# Patient Record
Sex: Female | Born: 1995 | Race: White | Hispanic: No | Marital: Single
Health system: Southern US, Community
[De-identification: ages and names within clinical notes are randomized; demographics above are authoritative.]

## PROBLEM LIST (undated history)

## (undated) DIAGNOSIS — R197 Diarrhea, unspecified: Secondary | ICD-10-CM

## (undated) DIAGNOSIS — R109 Unspecified abdominal pain: Secondary | ICD-10-CM

## (undated) DIAGNOSIS — T7840XA Allergy, unspecified, initial encounter: Secondary | ICD-10-CM

## (undated) HISTORY — DX: Unspecified abdominal pain: R10.9

## (undated) HISTORY — PX: REPAIR ANKLE LIGAMENT: SUR1187

## (undated) HISTORY — DX: Allergy, unspecified, initial encounter: T78.40XA

## (undated) HISTORY — DX: Diarrhea, unspecified: R19.7

## (undated) HISTORY — PX: WISDOM TOOTH EXTRACTION: SHX21

---
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
## 2019-10-28 DIAGNOSIS — L509 Urticaria, unspecified: Secondary | ICD-10-CM

## 2019-10-28 DIAGNOSIS — T782XXA Anaphylactic shock, unspecified, initial encounter: Secondary | ICD-10-CM

## 2019-10-29 DIAGNOSIS — T782XXA Anaphylactic shock, unspecified, initial encounter: Secondary | ICD-10-CM | POA: Diagnosis not present

## 2019-10-29 DIAGNOSIS — L509 Urticaria, unspecified: Secondary | ICD-10-CM | POA: Diagnosis not present

## 2019-10-30 DIAGNOSIS — T782XXA Anaphylactic shock, unspecified, initial encounter: Secondary | ICD-10-CM | POA: Diagnosis not present

## 2019-10-30 DIAGNOSIS — L509 Urticaria, unspecified: Secondary | ICD-10-CM | POA: Diagnosis not present

## 2019-10-31 ENCOUNTER — Other Ambulatory Visit: Payer: Self-pay

## 2019-10-31 ENCOUNTER — Encounter: Payer: Self-pay | Admitting: Allergy

## 2019-10-31 ENCOUNTER — Ambulatory Visit (INDEPENDENT_AMBULATORY_CARE_PROVIDER_SITE_OTHER): Payer: Self-pay | Admitting: Allergy

## 2019-10-31 VITALS — BP 122/54 | HR 84 | Temp 98.0°F | Resp 22 | Ht 62.3 in | Wt 135.0 lb

## 2019-10-31 DIAGNOSIS — T783XXD Angioneurotic edema, subsequent encounter: Secondary | ICD-10-CM

## 2019-10-31 DIAGNOSIS — L509 Urticaria, unspecified: Secondary | ICD-10-CM

## 2019-10-31 MED ORDER — MONTELUKAST SODIUM 10 MG PO TABS: 10.0000 mg | ORAL_TABLET | Freq: Every day | ORAL | 5 refills | Status: AC

## 2019-10-31 NOTE — Patient Instructions (Signed)
Hives and swelling  - at this time etiology of hives and swelling is unknown.  Hives/swelling can be caused by a variety of different triggers including illness/infection, foods, medications, stings, exercise, pressure, vibrations, extremes of temperature to name a few however majority of the time there is no identifiable trigger.    - your CBC and CMP (blood cell counts and liver/kidney function) were largely unremarkable.  Inflammatory markers were elevated.   - will obtain further work-up to see if we can identify any particular triggers  - for management of symptoms recommend following high-dose antihistamine regimen: Zyrtec 10mg  twice a day, Pepcid 20mg  twice a day and Singulair 10mg  daily at bedtime.  If you notice any change in mood/behavior/sleep after starting Singulair then stop this medication and let know.  Symptoms resolve after stopping the medication.    - complete prednisone course  - we discussed briefly Xolair monthly injections if high-dose antihistamine regimen above is not effective enough  - provided with AuviQ sample today and follow emergency action plan in case of allergic reaction that warrants epinephrine use  Follow-up 2-3 months or sooner if needed

## 2019-10-31 NOTE — Progress Notes (Signed)
New Patient Note  RE: Tonya Fitzpatrick MRN: [id number] DOB: 09-06-1995 Date of Office Visit: 10/31/2019  Referring provider: No ref. provider found Primary care provider: Patient, No Pcp Per  Chief Complaint: hives, swelling, reaction  History of present illness: Tonya Fitzpatrick is a 24 y.o. female presenting today for evaluation of hives and swelling.   She states about a week ago, Tuesday morning she developed red dots on her skin on her arms and in the groin area.  She states the rash later progressed all over her body.  Went to UC and reports receiving a steroid injection.  She states that didn't help and went back to UC where she got prednisone pack and pepcid to take.    The next day, Wednesday morning she states she woke up with facial sweling.   She took benadryl which did help some.  On Saturday, but following the weekend, she woke up and her lips was swollen.  Took benadryl and went back to sleep.  She woke up again and states 'everything' was swollen on her face and reports it hurt to swallow and felt like she was having difficulty breathing.   She took benadryl and went to ED.  She denies any new foods.  No new medications.  No stings.  No change in body products/lotions or detergents.  She states some of the areas where she has had hives are leaving a dark mark behind.  She also notes some aches in her neck and ankles.  Denies any true fevers but states she had low-grade temperature up to 99.3. The first week of April she thought she had strep throat due to seeing white patches at back of her throat and she went to [facility] and was told it was too early to test.  She was covid tested which was negative.  She was advised to take allergy medication.  Around April 8 she reports having stomach bug (24 hour virus she states her daughter gave to her).    She eats red meats in her diet almost everyday.   She feels she has low iron which is why she eats a lot of red meats.  She is still taking  predisone (x 6 days) and pepcid twice a day following her hospital admission.   She currently does not have an epinephrine device.  In the ED on 10/28/2019 she was noted to have stable vitals.  Her exam was notable for diffuse urticarial appearing and blanching red rash.  She was given a dose of IM epi x3.  The ED note also states that she received FFP by IV. She did have labs done in the hospital and through her admission.  Her C-reactive protein was notable to be 40.9 which is elevated.  She was also tested for mono with a negative Monospot as well as negative ASO titers.  She also had negative Lyme and Rickettsia titers. Her CBC initially had a slightly elevated white count but did improve for the course. She did have a negative Covid test. She was started on Solu-Medrol as well as Benadryl, Pepcid and hydroxyzine.  Her rash and swelling did improve and she was able to be discharged on 10/30/2019 to continue a prednisone first taper for 6 days which she is still taking.  She was also discharged to continue on Pepcid twice a day and Benadryl as needed.   She reports having seasonal allergies mostly in spring with symptoms including nasal congestion, sneezing.   She states she "  got use to it" [the symptoms] and does not take any medications for.   No history eczema, asthma or food allergy.   Review of systems: Review of Systems  Constitutional: Negative.   HENT:       See HPI   Eyes: Negative.   Respiratory:       See HPI  Cardiovascular: Negative.   Gastrointestinal: Negative.   Musculoskeletal: Negative.   Skin:       See HPI  Neurological: Negative.     All other systems negative unless noted above in HPI  Past medical history: Past Medical History:  Diagnosis Date  . Abdominal pain   . Allergic reaction   . Diarrhea     Past surgical history: Past Surgical History:  Procedure Laterality Date  . CESAREAN SECTION    . REPAIR ANKLE LIGAMENT Left   . WISDOM TOOTH EXTRACTION       Family history:  Family History  Problem Relation Age of Onset  . Asthma Sister   . Food Allergy Sister   . Depression Sister   . Asthma Brother   . Depression Brother   . Irritable bowel syndrome Mother   . Nephrolithiasis Father   . Cholelithiasis Father   . Ulcers Neg Hx   . Celiac disease Neg Hx     Social history: She lives in a home with electric heating and central cooling.  Dogs in the home.  No carpeting.  No concern for water damage, mildew or roaches in the home.  She works as a Location manager.  Denies a smoking history.  Medication List: Current Outpatient Medications  Medication Sig Dispense Refill  . diphenhydrAMINE HCl (BENADRYL PO) Take by mouth.    . famotidine (PEPCID) 20 MG tablet Take 20 mg by mouth 2 (two) times daily.     . predniSONE (DELTASONE) 5 MG tablet Take 6 on day one, 5 on day two, 4 on day three, 3 on day four, 2 on day five, and 1 on day six.    Marland Kitchen montelukast (SINGULAIR) 10 MG tablet Take 1 tablet (10 mg total) by mouth at bedtime. 30 tablet 5   No current facility-administered medications for this visit.    Known medication allergies: No Known Allergies   Physical examination: Blood pressure (!) 122/54, pulse 84, temperature 98 F (36.7 C), temperature source Temporal, resp. rate (!) 22, height 5' 2.3" (1.582 m), weight 135 lb (61.2 kg), SpO2 97 %.  General: Alert, interactive, in no acute distress. HEENT: PERRLA, TMs pearly gray, turbinates non-edematous without discharge, post-pharynx non erythematous. Neck: Supple without lymphadenopathy. Lungs: Clear to auscultation without wheezing, rhonchi or rales. {no increased work of breathing. CV: Normal S1, S2 without murmurs. Abdomen: Nondistended, nontender. Skin: Warm and dry, without lesions or rashes. Extremities:  No clubbing, cyanosis or edema. Neuro:   Grossly intact.  Diagnositics/Labs: Labs: Reviewed labs obtained during her hospital admission see HPI Will have  discharge summary scanned into EMR  Allergy testing: Deferred due to her recent symptoms and prednisone use   Assessment and plan:   Urticaria with angioedema, acute  - at this time etiology of hives and swelling is unknown.  There is some concern that her recent episode of hives and swelling may have been triggered by illness earlier this month.  Hives/swelling can be caused by a variety of different triggers including illness/infection, foods, medications, stings, exercise, pressure, vibrations, extremes of temperature to name a few however majority of the time there is  no identifiable trigger.    - your CBC and CMP were largely unremarkable and improve throughout the admission, namely the white blood cell count.  Inflammatory markers were elevated.   - will obtain further work-up to see if we can identify any particular triggers  - for management of symptoms recommend following high-dose antihistamine regimen: Zyrtec 10mg  twice a day, Pepcid 20mg  twice a day and Singulair 10mg  daily at bedtime.  If you notice any change in mood/behavior/sleep after starting Singulair then stop this medication and let know.  Symptoms resolve after stopping the medication.    - complete prednisone course  - we discussed briefly Xolair monthly injections if high-dose antihistamine regimen above is not effective enough  - provided with AuviQ sample today and follow emergency action plan in case of allergic reaction that warrants epinephrine use  Follow-up 2-3 months or sooner if needed   I appreciate the opportunity to take part in Emerlyn's care. Please do not hesitate to contact me with questions.  Sincerely,   , MD Allergy/Immunology [facility]

## 2020-01-02 ENCOUNTER — Ambulatory Visit: Payer: Self-pay | Admitting: Allergy

## 2022-03-19 DIAGNOSIS — R011 Cardiac murmur, unspecified: Secondary | ICD-10-CM
